# Patient Record
Sex: Female | Born: 1955 | Race: Black or African American | Hispanic: No | Marital: Married
Health system: Southern US, Community
[De-identification: ages and names within clinical notes are randomized; demographics above are authoritative.]

## PROBLEM LIST (undated history)

## (undated) DIAGNOSIS — I10 Essential (primary) hypertension: Secondary | ICD-10-CM

## (undated) DIAGNOSIS — M1990 Unspecified osteoarthritis, unspecified site: Secondary | ICD-10-CM

---

## 2014-05-02 ENCOUNTER — Encounter (HOSPITAL_BASED_OUTPATIENT_CLINIC_OR_DEPARTMENT_OTHER): Payer: Self-pay | Admitting: Emergency Medicine

## 2014-05-02 ENCOUNTER — Emergency Department (HOSPITAL_BASED_OUTPATIENT_CLINIC_OR_DEPARTMENT_OTHER): Payer: No Typology Code available for payment source

## 2014-05-02 ENCOUNTER — Emergency Department (HOSPITAL_BASED_OUTPATIENT_CLINIC_OR_DEPARTMENT_OTHER)
Admission: EM | Admit: 2014-05-02 | Discharge: 2014-05-02 | Disposition: A | Discharge status: Home / Self Care | Payer: No Typology Code available for payment source | Attending: Emergency Medicine | Admitting: Emergency Medicine

## 2014-05-02 DIAGNOSIS — S79919A Unspecified injury of unspecified hip, initial encounter: Secondary | ICD-10-CM | POA: Insufficient documentation

## 2014-05-02 DIAGNOSIS — I10 Essential (primary) hypertension: Secondary | ICD-10-CM | POA: Diagnosis not present

## 2014-05-02 DIAGNOSIS — Z8739 Personal history of other diseases of the musculoskeletal system and connective tissue: Secondary | ICD-10-CM | POA: Diagnosis not present

## 2014-05-02 DIAGNOSIS — Z79899 Other long term (current) drug therapy: Secondary | ICD-10-CM | POA: Insufficient documentation

## 2014-05-02 DIAGNOSIS — IMO0002 Reserved for concepts with insufficient information to code with codable children: Secondary | ICD-10-CM | POA: Diagnosis not present

## 2014-05-02 DIAGNOSIS — S8990XA Unspecified injury of unspecified lower leg, initial encounter: Secondary | ICD-10-CM | POA: Diagnosis present

## 2014-05-02 DIAGNOSIS — Y92410 Unspecified street and highway as the place of occurrence of the external cause: Secondary | ICD-10-CM | POA: Diagnosis not present

## 2014-05-02 DIAGNOSIS — Y9389 Activity, other specified: Secondary | ICD-10-CM | POA: Insufficient documentation

## 2014-05-02 DIAGNOSIS — S99929A Unspecified injury of unspecified foot, initial encounter: Principal | ICD-10-CM

## 2014-05-02 DIAGNOSIS — S79929A Unspecified injury of unspecified thigh, initial encounter: Secondary | ICD-10-CM

## 2014-05-02 DIAGNOSIS — S99919A Unspecified injury of unspecified ankle, initial encounter: Principal | ICD-10-CM

## 2014-05-02 HISTORY — DX: Essential (primary) hypertension: I10

## 2014-05-02 HISTORY — DX: Unspecified osteoarthritis, unspecified site: M19.90

## 2014-05-02 MED ORDER — HYDROCODONE-ACETAMINOPHEN 5-325 MG PO TABS
2.0000 | ORAL_TABLET | Freq: Once | ORAL | Status: AC
  Administered 2014-05-02: 2 via ORAL
  Filled 2014-05-02: qty 2

## 2014-05-02 MED ORDER — HYDROCODONE-ACETAMINOPHEN 5-325 MG PO TABS: 1.0000 | ORAL_TABLET | Freq: Four times a day (QID) | ORAL | Status: AC | PRN

## 2014-05-02 NOTE — Discharge Instructions (Signed)
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
## 2016-02-03 IMAGING — CR DG FEMUR 2V*L*
4 series · 4 of 4 positions shown · non-contrast
Comparison: None.

CLINICAL DATA: Proximal pain

EXAM:
LEFT FEMUR - 2 VIEW

[t femur with hip  ap left]
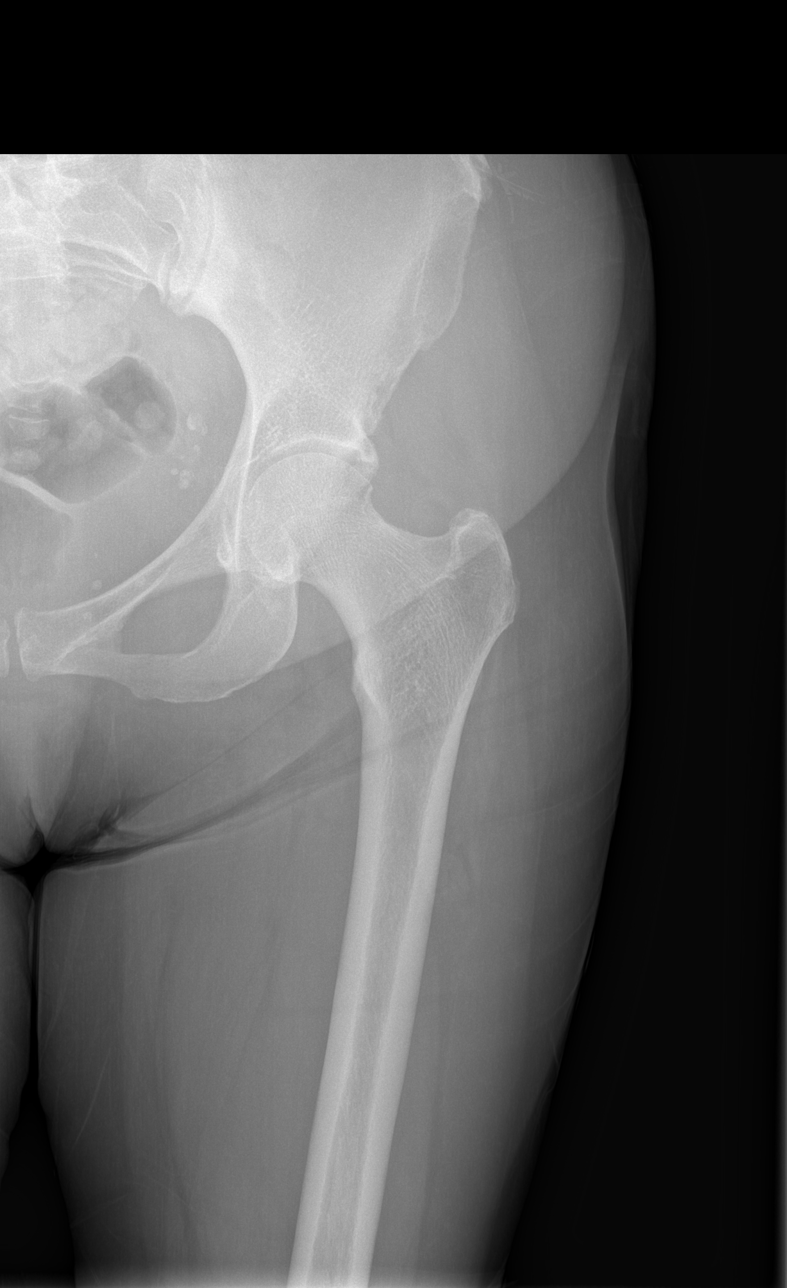

[t femur with knee ap left]
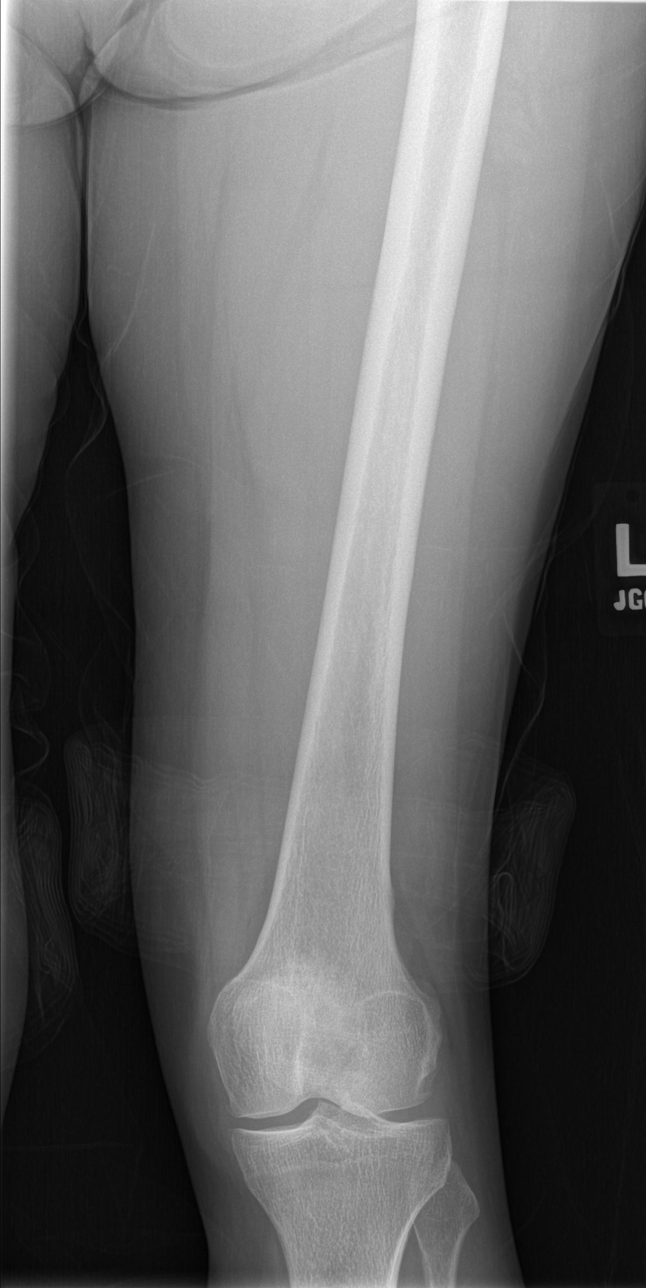

[t femur with hip lat left]
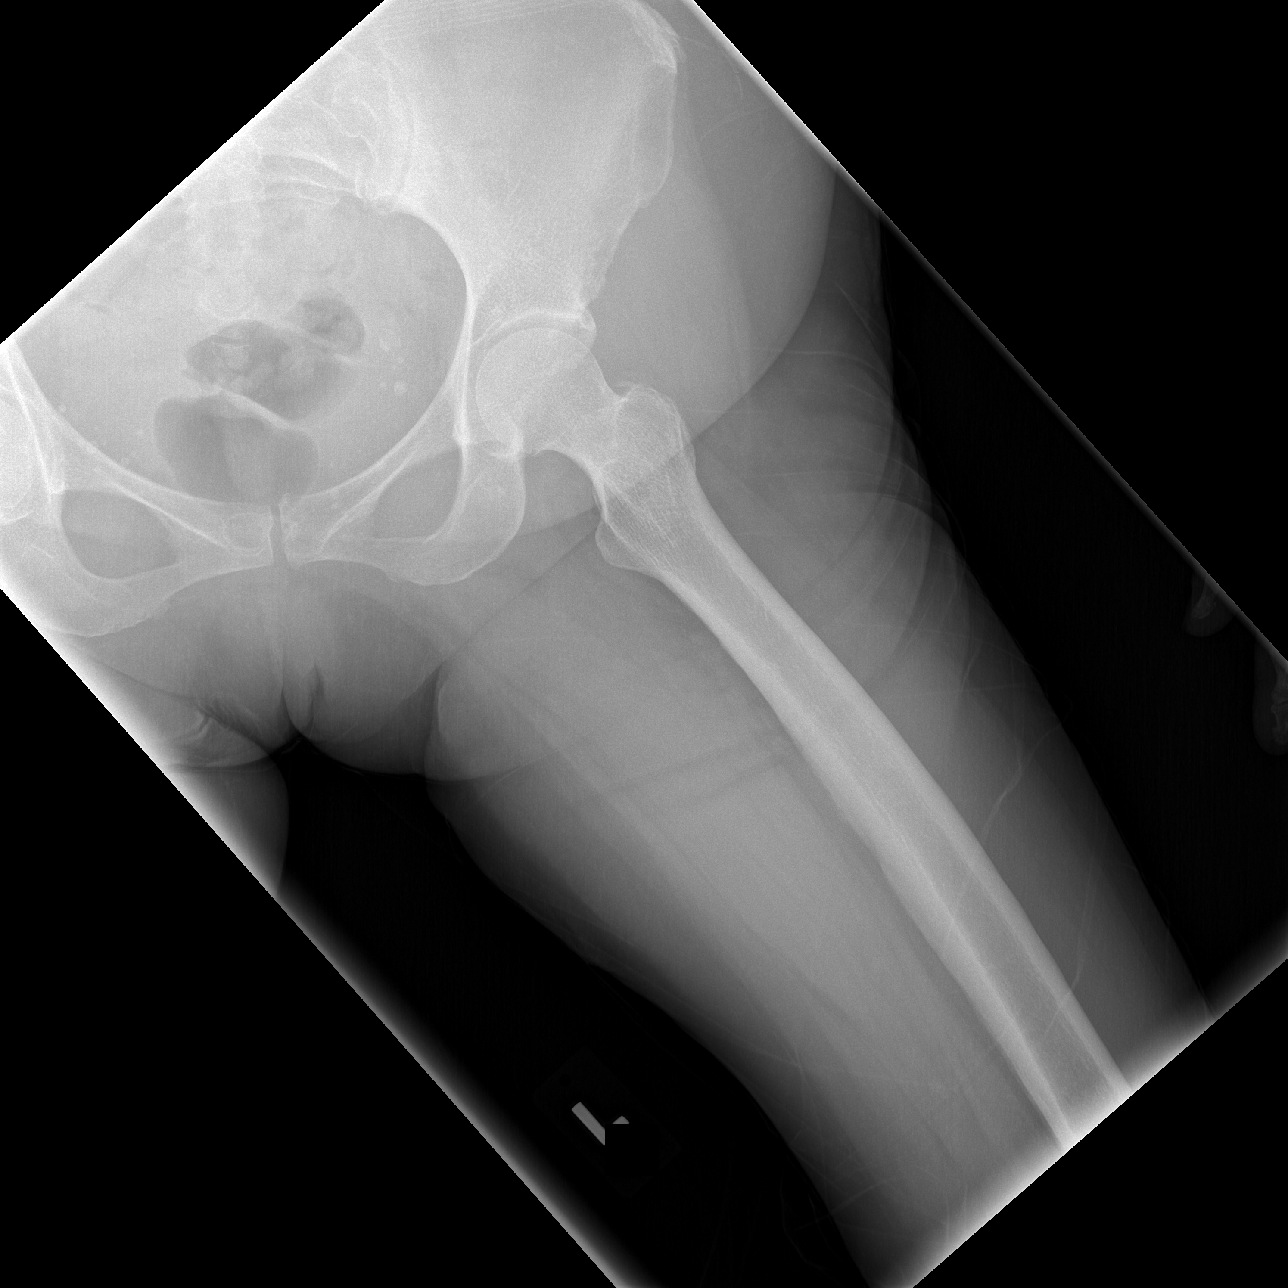

[t femur with knee lat left]
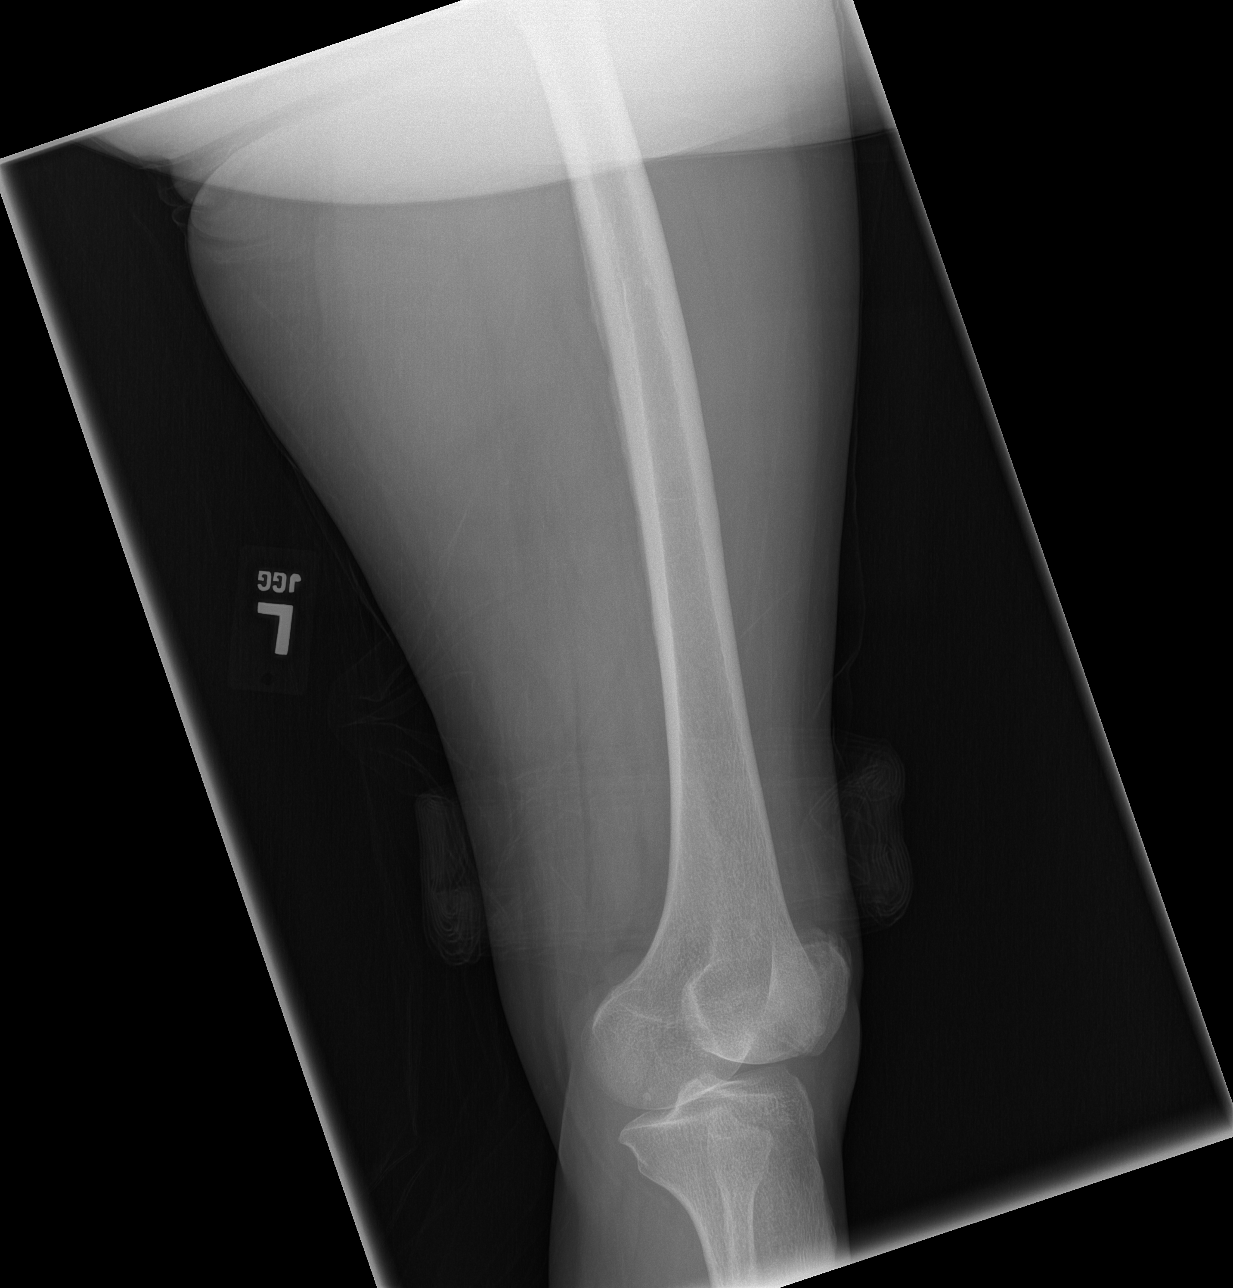

[4 of 4 positions shown; findings below may reference images not displayed]

FINDINGS: Four views of the left femur submitted. No acute fracture or
subluxation. No radiopaque foreign body.
IMPRESSION: Negative.

## 2018-09-13 ENCOUNTER — Ambulatory Visit: Payer: Self-pay | Admitting: Emergency Medicine
